# Patient Record
Sex: Female | Born: 1935 | Race: White | Hispanic: No | Marital: Single
Health system: Southern US, Community
[De-identification: ages and names within clinical notes are randomized; demographics above are authoritative.]

---

## 2003-01-30 ENCOUNTER — Other Ambulatory Visit: Payer: Self-pay

## 2004-10-10 ENCOUNTER — Ambulatory Visit: Payer: Self-pay | Admitting: General Surgery

## 2004-10-10 ENCOUNTER — Other Ambulatory Visit: Payer: Self-pay

## 2004-10-16 ENCOUNTER — Ambulatory Visit: Payer: Self-pay | Admitting: General Surgery

## 2004-11-16 ENCOUNTER — Ambulatory Visit: Payer: Self-pay | Admitting: Internal Medicine

## 2004-12-15 ENCOUNTER — Ambulatory Visit: Payer: Self-pay | Admitting: Internal Medicine

## 2005-01-15 ENCOUNTER — Ambulatory Visit: Payer: Self-pay | Admitting: Internal Medicine

## 2005-02-15 ENCOUNTER — Ambulatory Visit: Payer: Self-pay | Admitting: Internal Medicine

## 2005-03-15 ENCOUNTER — Ambulatory Visit: Payer: Self-pay | Admitting: Internal Medicine

## 2005-04-15 ENCOUNTER — Ambulatory Visit: Payer: Self-pay | Admitting: Internal Medicine

## 2005-05-15 ENCOUNTER — Ambulatory Visit: Payer: Self-pay | Admitting: Internal Medicine

## 2005-05-30 ENCOUNTER — Ambulatory Visit: Payer: Self-pay | Admitting: General Surgery

## 2005-06-15 ENCOUNTER — Ambulatory Visit: Payer: Self-pay | Admitting: Internal Medicine

## 2005-07-15 ENCOUNTER — Ambulatory Visit: Payer: Self-pay | Admitting: Internal Medicine

## 2005-08-15 ENCOUNTER — Ambulatory Visit: Payer: Self-pay | Admitting: Internal Medicine

## 2005-09-15 ENCOUNTER — Ambulatory Visit: Payer: Self-pay | Admitting: Internal Medicine

## 2005-10-15 ENCOUNTER — Ambulatory Visit: Payer: Self-pay | Admitting: Internal Medicine

## 2005-11-15 ENCOUNTER — Ambulatory Visit: Payer: Self-pay | Admitting: Internal Medicine

## 2005-11-19 ENCOUNTER — Ambulatory Visit: Payer: Self-pay | Admitting: General Surgery

## 2005-12-15 ENCOUNTER — Ambulatory Visit: Payer: Self-pay | Admitting: Internal Medicine

## 2006-01-15 ENCOUNTER — Ambulatory Visit: Payer: Self-pay | Admitting: Internal Medicine

## 2006-02-15 ENCOUNTER — Ambulatory Visit: Payer: Self-pay | Admitting: Internal Medicine

## 2006-03-16 ENCOUNTER — Ambulatory Visit: Payer: Self-pay | Admitting: Internal Medicine

## 2006-04-16 ENCOUNTER — Ambulatory Visit: Payer: Self-pay | Admitting: Internal Medicine

## 2006-05-16 ENCOUNTER — Ambulatory Visit: Payer: Self-pay | Admitting: Internal Medicine

## 2006-06-16 ENCOUNTER — Ambulatory Visit: Payer: Self-pay | Admitting: Internal Medicine

## 2006-07-02 ENCOUNTER — Ambulatory Visit: Payer: Self-pay | Admitting: Internal Medicine

## 2006-07-10 ENCOUNTER — Other Ambulatory Visit: Payer: Self-pay

## 2006-07-10 ENCOUNTER — Ambulatory Visit: Payer: Self-pay | Admitting: Ophthalmology

## 2006-07-16 ENCOUNTER — Ambulatory Visit: Payer: Self-pay | Admitting: Radiation Oncology

## 2006-07-17 ENCOUNTER — Ambulatory Visit: Payer: Self-pay | Admitting: Ophthalmology

## 2006-08-01 ENCOUNTER — Ambulatory Visit: Payer: Self-pay | Admitting: Internal Medicine

## 2006-08-16 ENCOUNTER — Ambulatory Visit: Payer: Self-pay | Admitting: Radiation Oncology

## 2006-08-16 ENCOUNTER — Ambulatory Visit: Payer: Self-pay | Admitting: Internal Medicine

## 2006-09-16 ENCOUNTER — Ambulatory Visit: Payer: Self-pay | Admitting: Internal Medicine

## 2006-10-16 ENCOUNTER — Ambulatory Visit: Payer: Self-pay | Admitting: Internal Medicine

## 2006-10-21 ENCOUNTER — Ambulatory Visit: Payer: Self-pay | Admitting: General Surgery

## 2006-10-22 ENCOUNTER — Ambulatory Visit: Payer: Self-pay | Admitting: Internal Medicine

## 2006-11-16 ENCOUNTER — Ambulatory Visit: Payer: Self-pay | Admitting: Internal Medicine

## 2006-12-16 ENCOUNTER — Ambulatory Visit: Payer: Self-pay | Admitting: Internal Medicine

## 2007-01-16 ENCOUNTER — Ambulatory Visit: Payer: Self-pay | Admitting: Internal Medicine

## 2007-02-16 ENCOUNTER — Ambulatory Visit: Payer: Self-pay | Admitting: Internal Medicine

## 2007-03-16 ENCOUNTER — Ambulatory Visit: Payer: Self-pay | Admitting: Internal Medicine

## 2007-04-16 ENCOUNTER — Ambulatory Visit: Payer: Self-pay | Admitting: Internal Medicine

## 2007-05-16 ENCOUNTER — Ambulatory Visit: Payer: Self-pay | Admitting: Internal Medicine

## 2007-06-16 ENCOUNTER — Ambulatory Visit: Payer: Self-pay | Admitting: Internal Medicine

## 2007-07-16 ENCOUNTER — Ambulatory Visit: Payer: Self-pay | Admitting: Internal Medicine

## 2007-08-16 ENCOUNTER — Ambulatory Visit: Payer: Self-pay | Admitting: Internal Medicine

## 2007-09-16 ENCOUNTER — Ambulatory Visit: Payer: Self-pay | Admitting: Internal Medicine

## 2007-10-21 ENCOUNTER — Ambulatory Visit: Payer: Self-pay | Admitting: Internal Medicine

## 2007-10-22 ENCOUNTER — Ambulatory Visit: Payer: Self-pay | Admitting: Family Medicine

## 2007-11-16 ENCOUNTER — Ambulatory Visit: Payer: Self-pay | Admitting: Internal Medicine

## 2007-11-25 ENCOUNTER — Ambulatory Visit: Payer: Self-pay | Admitting: Internal Medicine

## 2007-12-16 ENCOUNTER — Ambulatory Visit: Payer: Self-pay | Admitting: Internal Medicine

## 2008-01-16 ENCOUNTER — Ambulatory Visit: Payer: Self-pay | Admitting: Internal Medicine

## 2008-02-16 ENCOUNTER — Ambulatory Visit: Payer: Self-pay | Admitting: Internal Medicine

## 2008-02-17 ENCOUNTER — Ambulatory Visit: Payer: Self-pay | Admitting: Internal Medicine

## 2008-03-15 ENCOUNTER — Ambulatory Visit: Payer: Self-pay | Admitting: Internal Medicine

## 2008-04-15 ENCOUNTER — Ambulatory Visit: Payer: Self-pay | Admitting: Internal Medicine

## 2008-05-15 ENCOUNTER — Ambulatory Visit: Payer: Self-pay | Admitting: Internal Medicine

## 2008-06-08 ENCOUNTER — Ambulatory Visit: Payer: Self-pay | Admitting: Internal Medicine

## 2008-06-15 ENCOUNTER — Ambulatory Visit: Payer: Self-pay | Admitting: Internal Medicine

## 2008-07-15 ENCOUNTER — Ambulatory Visit: Payer: Self-pay | Admitting: Internal Medicine

## 2008-08-15 ENCOUNTER — Ambulatory Visit: Payer: Self-pay | Admitting: Internal Medicine

## 2008-09-15 ENCOUNTER — Ambulatory Visit: Payer: Self-pay | Admitting: Internal Medicine

## 2008-10-15 ENCOUNTER — Ambulatory Visit: Payer: Self-pay | Admitting: Internal Medicine

## 2008-11-15 ENCOUNTER — Ambulatory Visit: Payer: Self-pay | Admitting: Internal Medicine

## 2008-12-21 ENCOUNTER — Ambulatory Visit: Payer: Self-pay | Admitting: Internal Medicine

## 2009-01-15 ENCOUNTER — Ambulatory Visit: Payer: Self-pay | Admitting: Internal Medicine

## 2009-02-22 ENCOUNTER — Ambulatory Visit: Payer: Self-pay | Admitting: Internal Medicine

## 2009-03-15 ENCOUNTER — Ambulatory Visit: Payer: Self-pay | Admitting: Internal Medicine

## 2009-04-12 ENCOUNTER — Ambulatory Visit: Payer: Self-pay | Admitting: Internal Medicine

## 2009-04-15 ENCOUNTER — Ambulatory Visit: Payer: Self-pay | Admitting: Internal Medicine

## 2009-07-12 ENCOUNTER — Ambulatory Visit: Payer: Self-pay | Admitting: Internal Medicine

## 2009-07-15 ENCOUNTER — Ambulatory Visit: Payer: Self-pay | Admitting: Internal Medicine

## 2009-10-11 ENCOUNTER — Ambulatory Visit: Payer: Self-pay | Admitting: Internal Medicine

## 2009-10-15 ENCOUNTER — Ambulatory Visit: Payer: Self-pay | Admitting: Internal Medicine

## 2009-11-15 ENCOUNTER — Ambulatory Visit: Payer: Self-pay | Admitting: Internal Medicine

## 2009-12-15 ENCOUNTER — Ambulatory Visit: Payer: Self-pay | Admitting: Internal Medicine

## 2013-01-09 ENCOUNTER — Inpatient Hospital Stay: Payer: Self-pay | Admitting: Internal Medicine

## 2013-01-09 LAB — COMPREHENSIVE METABOLIC PANEL
Alkaline Phosphatase: 103 U/L
Anion Gap: 19 — ABNORMAL HIGH (ref 7–16)
BUN: 75 mg/dL — ABNORMAL HIGH (ref 7–18)
Bilirubin,Total: 0.5 mg/dL (ref 0.2–1.0)
Chloride: 90 mmol/L — ABNORMAL LOW (ref 98–107)
Creatinine: 4.29 mg/dL — ABNORMAL HIGH (ref 0.60–1.30)
EGFR (African American): 10 — ABNORMAL LOW
EGFR (Non-African Amer.): 9 — ABNORMAL LOW
SGOT(AST): 24 U/L (ref 15–37)
SGPT (ALT): 19 U/L (ref 12–78)
Sodium: 126 mmol/L — ABNORMAL LOW (ref 136–145)
Total Protein: 6.9 g/dL (ref 6.4–8.2)

## 2013-01-09 LAB — CBC WITH DIFFERENTIAL/PLATELET
Basophil #: 0 10*3/uL (ref 0.0–0.1)
Basophil %: 0.2 %
Eosinophil #: 0 10*3/uL (ref 0.0–0.7)
Eosinophil %: 0 %
HGB: 10.6 g/dL — ABNORMAL LOW (ref 12.0–16.0)
Lymphocyte #: 0.6 10*3/uL — ABNORMAL LOW (ref 1.0–3.6)
Lymphocyte %: 4 %
MCV: 93 fL (ref 80–100)
Monocyte %: 6.9 %
Neutrophil #: 14.4 10*3/uL — ABNORMAL HIGH (ref 1.4–6.5)
Platelet: 201 10*3/uL (ref 150–440)
RBC: 3.68 10*6/uL — ABNORMAL LOW (ref 3.80–5.20)

## 2013-01-09 LAB — RAPID INFLUENZA A&B ANTIGENS

## 2013-01-09 LAB — CK-MB
CK-MB: 9.5 ng/mL — ABNORMAL HIGH (ref 0.5–3.6)
CK-MB: 8.4 ng/mL — ABNORMAL HIGH (ref 0.5–3.6)

## 2013-01-09 LAB — APTT: Activated PTT: 33.2 secs (ref 23.6–35.9)

## 2013-01-10 DIAGNOSIS — I214 Non-ST elevation (NSTEMI) myocardial infarction: Secondary | ICD-10-CM

## 2013-01-10 DIAGNOSIS — N179 Acute kidney failure, unspecified: Secondary | ICD-10-CM

## 2013-01-10 DIAGNOSIS — E1310 Other specified diabetes mellitus with ketoacidosis without coma: Secondary | ICD-10-CM

## 2013-01-10 DIAGNOSIS — R7989 Other specified abnormal findings of blood chemistry: Secondary | ICD-10-CM

## 2013-01-10 DIAGNOSIS — J189 Pneumonia, unspecified organism: Secondary | ICD-10-CM

## 2013-01-10 LAB — CBC WITH DIFFERENTIAL/PLATELET
Basophil #: 0.1 10*3/uL (ref 0.0–0.1)
Eosinophil %: 0 %
HCT: 28 % — ABNORMAL LOW (ref 35.0–47.0)
Lymphocyte %: 6.5 %
MCH: 29.7 pg (ref 26.0–34.0)
MCHC: 33.3 g/dL (ref 32.0–36.0)
Monocyte #: 0.9 x10 3/mm (ref 0.2–0.9)
Neutrophil #: 12.1 10*3/uL — ABNORMAL HIGH (ref 1.4–6.5)
Platelet: 182 10*3/uL (ref 150–440)
WBC: 14 10*3/uL — ABNORMAL HIGH (ref 3.6–11.0)

## 2013-01-10 LAB — APTT
Activated PTT: 49.8 secs — ABNORMAL HIGH (ref 23.6–35.9)
Activated PTT: 50 secs — ABNORMAL HIGH (ref 23.6–35.9)

## 2013-01-10 LAB — URINALYSIS, COMPLETE
Bilirubin,UR: NEGATIVE
Glucose,UR: >=500 mg/dL
Hyaline Cast: 1
Leukocyte Esterase: NEGATIVE
Nitrite: NEGATIVE
Ph: 5
Protein: 30
RBC,UR: 2 /HPF
Specific Gravity: 1.014
Squamous Epithelial: 2
WBC UR: 5 /HPF

## 2013-01-10 LAB — COMPREHENSIVE METABOLIC PANEL
BUN: 77 mg/dL — ABNORMAL HIGH (ref 7–18)
Bilirubin,Total: 0.3 mg/dL (ref 0.2–1.0)
Calcium, Total: 8.5 mg/dL (ref 8.5–10.1)
Creatinine: 4.27 mg/dL — ABNORMAL HIGH (ref 0.60–1.30)
Glucose: 225 mg/dL — ABNORMAL HIGH (ref 65–99)
Osmolality: 295 (ref 275–301)
Potassium: 3.8 mmol/L (ref 3.5–5.1)
SGOT(AST): 25 U/L (ref 15–37)
SGPT (ALT): 17 U/L (ref 12–78)
Sodium: 132 mmol/L — ABNORMAL LOW (ref 136–145)
Total Protein: 6 g/dL — ABNORMAL LOW (ref 6.4–8.2)

## 2013-01-10 LAB — TSH: Thyroid Stimulating Horm: 0.231 u[IU]/mL — ABNORMAL LOW

## 2013-01-10 LAB — LIPID PANEL
Cholesterol: 109 mg/dL (ref 0–200)
HDL Cholesterol: 22 mg/dL — ABNORMAL LOW (ref 40–60)
Ldl Cholesterol, Calc: 53 mg/dL (ref 0–100)
Triglycerides: 169 mg/dL (ref 0–200)

## 2013-01-10 LAB — HEMOGLOBIN A1C: Hemoglobin A1C: 11.2 % — ABNORMAL HIGH (ref 4.2–6.3)

## 2013-01-10 LAB — CK-MB: CK-MB: 8.2 ng/mL — ABNORMAL HIGH (ref 0.5–3.6)

## 2013-01-10 LAB — MAGNESIUM: Magnesium: 1.5 mg/dL — ABNORMAL LOW

## 2013-01-11 DIAGNOSIS — I059 Rheumatic mitral valve disease, unspecified: Secondary | ICD-10-CM

## 2013-01-11 LAB — CBC WITH DIFFERENTIAL/PLATELET
Basophil #: 0.1 10*3/uL (ref 0.0–0.1)
Eosinophil #: 0 10*3/uL (ref 0.0–0.7)
HCT: 30.7 % — ABNORMAL LOW (ref 35.0–47.0)
HGB: 10.2 g/dL — ABNORMAL LOW (ref 12.0–16.0)
MCV: 89 fL (ref 80–100)
Monocyte #: 0.3 x10 3/mm (ref 0.2–0.9)
Neutrophil #: 15.8 10*3/uL — ABNORMAL HIGH (ref 1.4–6.5)
Platelet: 204 10*3/uL (ref 150–440)
RBC: 3.45 10*6/uL — ABNORMAL LOW (ref 3.80–5.20)
RDW: 14.5 % (ref 11.5–14.5)

## 2013-01-11 LAB — BASIC METABOLIC PANEL
Anion Gap: 10 (ref 7–16)
BUN: 74 mg/dL — ABNORMAL HIGH (ref 7–18)
Calcium, Total: 8.7 mg/dL (ref 8.5–10.1)
Chloride: 102 mmol/L (ref 98–107)
Creatinine: 3.93 mg/dL — ABNORMAL HIGH (ref 0.60–1.30)
EGFR (African American): 11 — ABNORMAL LOW
EGFR (Non-African Amer.): 10 — ABNORMAL LOW
Osmolality: 289 (ref 275–301)
Potassium: 4.6 mmol/L (ref 3.5–5.1)

## 2013-01-11 LAB — PRO B NATRIURETIC PEPTIDE: B-Type Natriuretic Peptide: 11232 pg/mL — ABNORMAL HIGH (ref 0–450)

## 2013-01-12 ENCOUNTER — Encounter: Payer: Self-pay | Admitting: *Deleted

## 2013-01-12 LAB — BASIC METABOLIC PANEL
BUN: 76 mg/dL — ABNORMAL HIGH (ref 7–18)
EGFR (African American): 12 — ABNORMAL LOW
EGFR (Non-African Amer.): 10 — ABNORMAL LOW

## 2013-01-12 LAB — EXPECTORATED SPUTUM ASSESSMENT W GRAM STAIN, RFLX TO RESP C

## 2013-01-14 LAB — CULTURE, BLOOD (SINGLE)

## 2013-02-15 DEATH — deceased

## 2014-05-07 NOTE — Consult Note (Signed)
 General Aspect 81-year-old female with severe hearing loss, history of breast cancer,  status post radiation therapy presenting with several days of malaise, anorexia, significant chills, fevers, significant nausea and vomiting for 4 days, shortness of breath with increased congestion, and sputum. Cardiology was consulted for elevated cardiac enz.  Family reports she has been feeling very weak with severe malaise, body aches. Several memb ers of her  family are positive flu. She has not been eating for the past few days.    she appeared dehydrated, labs confirming acute kidney failure, left lower lobe pneumonia on CXR, positive troponins, glucose level >600. The patient is admitted for NSTEMI, sepsis, PNA, DKA.   Present Illness . FAMILY HISTORY: Positive for MI in several sisters. Positive diabetes in her family. Her mother died when she was born, and her father left the family soon after. No other history of cancer in the family.   SOCIAL HISTORY: The patient used to smoke 1 pack a day for around 10 of 15 years, and she quit over 50 years ago. She does not drink. She lives at home with her son and daughter-in-law.   Physical Exam:  GEN well developed, well nourished, no acute distress   HEENT moist oral mucosa, Oropharynx clear, very hard of hearing   NECK supple   RESP normal resp effort   CARD Regular rate and rhythm  Murmur   Murmur Systolic   ABD soft  normal BS   LYMPH negative neck   EXTR negative edema   SKIN normal to palpation   NEURO motor/sensory function intact   PSYCH alert, A+O to time, place, person, good insight   Review of Systems:  Subjective/Chief Complaint Cough, malaise   General: Fatigue  Weakness   Skin: No Complaints   ENT: No Complaints   Eyes: No Complaints   Neck: No Complaints   Respiratory: Frequent cough  Wheezing   Cardiovascular: Dyspnea   Gastrointestinal: No Complaints   Genitourinary: No Complaints   Vascular: No  Complaints   Musculoskeletal: No Complaints   Neurologic: No Complaints   Hematologic: No Complaints   Endocrine: No Complaints   Psychiatric: No Complaints   Review of Systems: All other systems were reviewed and found to be negative   Medications/Allergies Reviewed Medications/Allergies reviewed     Diabetes:    breast cancer:        Admit Diagnosis:   SEPSIS: Onset Date: 10-Jan-2013, Status: Active, Description: SEPSIS  Home Medications: Medication Instructions Status  ferrous sulfate 325 mg oral enteric coated tablet 1 tab(s) orally 2 times a day  Active  citalopram 20 mg oral tablet 1 tab(s) orally once a day Active  lisinopril 20 mg oral tablet 1 tab(s) orally once a day Active  HumuLIN N human recombinant 100 units/mL subcutaneous suspension sliding scale subcutaneous as directed Active  acetaminophen-traMADol 325 mg-37.5 mg oral tablet 1 tab(s) orally 2 times a day, As Needed - for Pain Active  pravastatin 40 mg oral tablet 1 tab(s) orally once a day Active  Januvia 100 mg oral tablet 1 tab(s) orally once a day Active  Symbicort 80 mcg-4.5 mcg/inh inhalation aerosol 1 puff(s) inhaled 2 times a day Active  CeleBREX 200 mg oral capsule 1 cap(s) orally 2 times a day Active  glimepiride 4 mg oral tablet 1 tab(s) orally once a day Active  Caltrate 600 mg oral tablet 1 tab(s) orally once a day Active   Lab Results:  Thyroid:  27-Dec-14 01:41   Thyroid Stimulating   Hormone  0.231 (0.45-4.50 (International Unit)  ----------------------- Pregnant patients have  different reference  ranges for TSH:  - - - - - - - - - -  Pregnant, first trimetser:  0.36 - 2.50 uIU/mL)  Hepatic:  27-Dec-14 01:41   Bilirubin, Total 0.3  Alkaline Phosphatase 78 (45-117 NOTE: New Reference Range 12/05/12)  SGPT (ALT) 17  SGOT (AST) 25  Total Protein, Serum  6.0  Albumin, Serum  1.9  Routine Chem:  27-Dec-14 01:41   Glucose, Serum  225  BUN  77  Creatinine (comp)  4.27   Sodium, Serum  132  Potassium, Serum 3.8  Chloride, Serum 99  CO2, Serum 23  Calcium (Total), Serum 8.5  Osmolality (calc) 295  eGFR (African American)  10  eGFR (Non-African American)  9 (eGFR values <60mL/min/1.73 m2 may be an indication of chronic kidney disease (CKD). Calculated eGFR is useful in patients with stable renal function. The eGFR calculation will not be reliable in acutely ill patients when serum creatinine is changing rapidly. It is not useful in  patients on dialysis. The eGFR calculation may not be applicable to patients at the low and high extremes of body sizes, pregnant women, and vegetarians.)  Anion Gap 10  Magnesium, Serum  1.5 (1.8-2.4 THERAPEUTIC RANGE: 4-7 mg/dL TOXIC: > 10 mg/dL  -----------------------)  Hemoglobin A1c ([facility])  11.2 (The American Diabetes Association recommends that a primary goal of therapy should be <7% and that physicians should reevaluate the treatment regimen in patients with HbA1c values consistently >8%.)  Cholesterol, Serum 109  Triglycerides, Serum 169  HDL (INHOUSE)  22  VLDL Cholesterol Calculated 34  LDL Cholesterol Calculated 53 (Result(s) reported on 10 Jan 2013 at 02:11AM.)  Cardiac:  27-Dec-14 01:41   CPK-MB, Serum  8.2 (Result(s) reported on 10 Jan 2013 at 02:19AM.)  Routine Hem:  27-Dec-14 01:41   WBC (CBC)  14.0  RBC (CBC)  3.13  Hemoglobin (CBC)  9.3  Hematocrit (CBC)  28.0  Platelet Count (CBC) 182  MCV 89  MCH 29.7  MCHC 33.3  RDW 13.9  Neutrophil % 86.7  Lymphocyte % 6.5  Monocyte % 6.3  Eosinophil % 0.0  Basophil % 0.5  Neutrophil #  12.1  Lymphocyte #  0.9  Monocyte # 0.9  Eosinophil # 0.0  Basophil # 0.1 (Result(s) reported on 10 Jan 2013 at 02:02AM.)   EKG:  Interpretation EKG shows NSR with rate 100 bpm, no significant ST or T wave changes   Radiology Results: XRay:    26-Dec-14 17:24, Chest Portable Single View  Chest Portable Single View   REASON FOR EXAM:    short of  breath  COMMENTS:       PROCEDURE: DXR - DXR PORTABLE CHEST SINGLE VIEW  - Jan 09 2013  5:24PM     CLINICAL DATA:  Shortness of breath, vomiting    EXAM:  PORTABLE CHEST - 1 VIEW    COMPARISON:  10/23/2005    FINDINGS:  There is patchy left lower lobe and left mid lung zone airspace  opacity with small left pleural effusion noted, superimposed on  previous left basilar presumed scarring. The heart size is normal.  Right lung is grossly clear with mildly prominent interstitial  markings but no focal abnormality.     IMPRESSION:  Patchy left lower lobe and left mid lung zone airspace disease which  could represent pneumonia although followup to radiographic  resolution is recommended 4-6 weeks to exclude underlying  malignancy.        Electronically Signed    By: Conchita Paris M.D.    On: 01/09/2013 17:37     Verified By: Arline Asp, M.D.,    No Known Allergies:   Vital Signs/Nurse's Notes: **Vital Signs.:   27-Dec-14 08:00  Vital Signs Type Routine  Temperature Temperature (F) 98.6  Celsius 37  Temperature Source oral  Pulse Pulse 82  Respirations Respirations 41  Systolic BP Systolic BP 846  Diastolic BP (mmHg) Diastolic BP (mmHg) 53  Mean BP 70  Pulse Ox % Pulse Ox % 97  Pulse Ox Activity Level  At rest  Oxygen Delivery 3L  Pulse Ox Heart Rate 64    Impression 81 year old female with severe hearing loss, history of breast cancer,  status post radiation therapy presenting with several days of malaise, anorexia, significant chills, fevers, significant nausea and vomiting for 4 days, shortness of breath with increased congestion, and sputum. Cardiology was consulted for elevated cardiac enz.  1)NSTEMI likely not a primary ischemic event, CKMB trending down, no chest pain sx, no complaints today on heparin ggt Elevated cardiac markers from demand ischemia (sepsis/pna, renal failure, anemia, DKA) in setting of possible underlying CAD --as enz trending  downward, could hold heparin ggt. start on subcutaneous heparin continue aspirin  2)DKA: on insulin ggt likely from sepsis  3)PNA/sepsis on ABX, Left lower lobe, LML sx better today  4) Renal failure likely from dehydration, unable to exclude a component of ATN would monitor, gentle IVF   Electronic Signatures: Ida Rogue (MD)  (Signed 27-Dec-14 10:51)  Authored: General Aspect/Present Illness, History and Physical Exam, Review of System, Past Medical History, Health Issues, Home Medications, Labs, EKG , Radiology, Allergies, Vital Signs/Nurse's Notes, Impression/Plan   Last Updated: 27-Dec-14 10:51 by Ida Rogue (MD)

## 2014-05-07 NOTE — Discharge Summary (Signed)
PATIENT NAME:  Margaret Cherry, Margaret Cherry MR#:  [id number] DATE OF BIRTH:  [birth year]  DATE OF ADMISSION:  01/09/2013 DATE OF DISCHARGE:  01/12/2013  ADMISSION DIAGNOSES: 1. Sepsis.  2. Pneumonia.  3. Diabetic ketoacidosis. 4.  Elevated troponins.   DISCHARGE DIAGNOSES: 1. Sepsis secondary to pneumonia.  2. DKA secondary to pneumonia.  3. Elevated troponins, not acute coronary syndrome. This is secondary to demand ischemia.  4. Renal failure, likely chronic.  5. Hypomagnesemia.  6. Abnormal TSH normal  T4.   CONSULTS:  1. Dr. Mariah MillingGollan.  2. Dr. Thedore MinsSingh.    LABORATORY DATA: Sodium 133, potassium 4.2, chloride 102, bicarbonate 24, BUN 76, creatinine 3.86, glucose is 270.   HOSPITAL COURSE: [name] who presented with cough, shortness of breath and chest  tightness. For further details, please refer to history and physcial.  1.  Sepsis secondary to pneumonia. The patient was found to have community-acquired pneumonia on chest x-ray. She has multiorgan organ failure. Her sepsis is improving. 2. Community-acquired pneumonia. The patient is on Zosyn and azithromycin. She is being discharged with Levaquin. She does need some oxygen at discharge, although her pneumonia is getting better. She also has suspected influenza since she has had exposure. Her blood cultures are negative to date. Her influenza swab was negative. She will be discharged with Levaquin as well as Tamiflu.  3. Diabetic ketoacidosis from community-acquired pneumonia has resolved. We stopped her outpatient medications due to her renal failure. She is on sliding scale insulin and low dose amaryl. She should follow up with her primary doctor as to what medications would be best for her for her diabetes.  4. Elevated troponin, thought not to be from a non-STEMI per cardiology consult, it is likely secondary to demand ischemia from her sepsis and pneumonia.  5. Renal failure, probably a there is a chronic component. Her creatinine  has not much changed. There was a question if this is prerenal azotemia from some dehydration. Her creatinine has not really much changed with IV fluids. I suspect that she has stage III chronic renal failure. Her renal ultrasound did not show evidence of hydronephrosis. Her urine output did improve with IV fluids. She could also have ATN from sepsis and pneumonia. 6. Hypomagnesia, which  was repleted.  7. Abnormal TSH, but normal T4, likely sick euthyroid syndrome.   DISCHARGE MEDICATIONS: 1. Ferrous sulfate 325 b.i.d.  2. Citalopram 20 mg daily.  3. Tylenol/tramadol 325/37.5 b.i.d. p.r.n.  4. Pravastatin 40 mg daily.  5. Symbicort 1 puff b.i.d.  6. Caltrate 600 mg daily.  7. Sliding scale insulin.  8. Tamiflu 30 mg daily for five days.  9. Metoprolol 50 mg q.12 hours.  10. Levaquin 500 mg q.48 hours x 5 days.  11. Glucerna shakes t.i.d.  12. amaryl 2mg  daily  The patient shoul dhave close monitoring of her blood sugars  as her creatinie is elevated.   DISCHARGE OXYGEN: 2 liters nasal cannula.   DISCHARGE DIET: ADA.   DISCHARGE SUPPLEMENT: Glucerna t.i.d.    DISCHARGE ACTIVITY: As tolerated physical therapy. The patient will follow up with Dr. Mosetta PigeonHarmeet Cherry in one week. Again, the patient should have Accu-Cheks q. a.c. and at bedtime.  TIME SPENT: Approximately 40 minutes. The patient is medically stable for discharge.   ____________________________ Karoline Fleer P. Juliene PinaMody, MD spm:sg D: 01/12/2013 14:23:14 ET T: 01/12/2013 14:33:10 ET JOB#: [id number]  cc: Delyla Sandeen P. Juliene PinaMody, MD, <Dictator> Janyth ContesSITAL P Sundra Haddix MD ELECTRONICALLY SIGNED 01/12/2013 15:01

## 2014-05-07 NOTE — H&P (Signed)
PATIENT NAME:  Margaret Cherry, Margaret Cherry MR#:  [id number] DATE OF BIRTH:  December 16, 1924  DATE OF ADMISSION:  01/09/2013  REASON FOR ADMISSION: Cough, shortness of breath, chest tightness.   HISTORY OF PRESENT ILLNESS: This is a very nice 81 year old female. She is a very poor historian. She has severe hearing loss, and even her family has a problem communicating with her. She is 79, she has history of breast cancer, which has been clean for over 9 years, status post radiation therapy. The patient started developing 4 days ago malaise, not being able to eat, not being able to drink. She had significant chills, fevers, feeling cold and hot at the same time, had significant nausea and vomiting for 4 days, shortness of breath with increased congestion, and now her sputum is brown and green.   The patient has been feeling very weak and severe malaise, with a little bit of body aches. In her family members 3 of them have positive flu. The patient was brought to the Emergency Department, where she looks dehydrated. She has acute kidney failure, left lower lobe pneumonia, and positive troponins. The patient is admitted for treatment and control of these issues.   REVIEW OF SYSTEMS:  A 12-system review of systems.  GENERAL:  Positive fever. Positive fatigue. Positive weakness. No weight loss or weight gain.  EYES: No blurry vision, double vision.  ENT: No difficulty swallowing or tinnitus.  RESPIRATORY: Positive cough. Positive wheezing. Positive painful respirations.  CARDIOVASCULAR: No chest pain, orthopnea. Positive chest tightness. No palpitations.  GENITOURINARY: No dysuria, hematuria.   GASTROINTESTINAL: No abdominal pain. Positive nausea. Positive vomiting x 2 today. No GERD. No hematemesis. No melena.  GYNECOLOGIC: No breast masses.  ENDOCRINE: No polyuria, polydipsia, polyphagia. The patient has been really dry, dehydrated. Not one to drink water. No cold or heat intolerance.  HEMATOLOGIC AND LYMPHATIC: No  anemia, easy bruising or bleeding.  SKIN: No rashes or petechiae.  MUSCULOSKELETAL: No significant neck pain, back pain, or gout.  NEUROLOGIC: No numbness, tingling, CVA or TIA.   PSYCHIATRIC: No insomnia. Positive depression.  PAST MEDICAL HISTORY:  1.  Diabetes.  2.  Breast cancer.  3.  Hypertension.  4.  COPD.   5.  Depression.  6.  Anxiety.  7.  Osteoporosis.  8.  Anemia.   ALLERGIES: No known drug allergies.   SURGICAL HISTORY:  1.  Hysterectomy.  2.  Lumpectomy.  3.  Cholecystectomy.   FAMILY HISTORY: Positive for MI in several sisters. Positive diabetes in her family. Her mother died when she was born, and her father left the family soon after. No other history of cancer in the family.   SOCIAL HISTORY: The patient used to smoke 1 pack a day for around 10 of 15 years, and she quit over 50 years ago. She does not drink. She lives at home with her son and daughter-in-law.   CURRENT MEDICATIONS: Symbicort 80 mcg twice daily, pravastatin 40 mg daily, lisinopril 20 mg once a day, Januvia 100 mg once a day, Humulin sliding scale, 4 mg once a day, ferrous sulfate 325 mg twice daily, citalopram 10 mg daily, Celebrex 200 mg twice daily, Caltrate once a day, acetaminophen with tramadol 1 twice daily.   PHYSICAL EXAMINATION: VITAL SIGNS: Blood pressure 144/60, pulse 91, respirations 18, temperature 99.5, oxygen saturation 94% on oxygen. There is no oxygen saturation documented on room air. GENERAL:  She looks critically ill, debilitated. She is very hard of hearing. She is deaf on the left  side and hard to hear on the right side. She looks dehydrated. She looks very pale. She is not in any acute distress right now.  HEENT: Her pupils are equal and reactive. Pale conjunctivae. No oral lesions. No oropharyngeal exudates. Very dry mucosa. Anicteric sclerae. Normocephalic, atraumatic.  NECK: Supple. No JVD. No thyromegaly. No adenopathy. No rigidity.  CARDIOVASCULAR: Regular rate and  rhythm. Tachycardic. No murmurs, rubs or gallops are appreciated at this moment. No displacement of PMI. No tenderness to palpation anterior chest wall. The patient feels tight on her anterior chest, though.  LUNGS: Significant rales at the level of the left lower lobe, and some rhonchi, diffuse, in both respiratory fields. The patient is not wheezing at this moment. There is no use of accessory muscles. There is significant cough with a lot of congestion, congestive sounds when she coughs.  ABDOMEN: Soft, nontender, nondistended. No hepatosplenomegaly. No masses. Bowel sounds are positive. No rebound, no guarding. No hepatosplenomegaly.  GENITAL: Negative for external lesions.  EXTREMITIES: No significant edema, cyanosis or clubbing.  VASCULAR: Pulses +2. Capillary refill around 6 seconds.  SKIN: Decreased turgor. No petechiae or rashes.   MUSCULOSKELETAL: No joint effusions or joint deformity.  NEUROLOGIC: Cranial nerves II to XII intact. Strength is 5/5 in 4 extremities.  PSYCHIATRIC: Some agitation. The patient has a flat affect.  LYMPHATIC: Negative for lymphadenopathy in the neck or supraclavicular areas.   RESULTS: Her glucose is 652, her BUN is 75, her creatinine is 4.29, her sodium is 126, corrected, glucoses around 135, 136, chloride less than 90, CO2 is 17, anion gap is 19, elevated. Her albumin is 2.5. Her troponin is 2.5. White count is 16,000, hemoglobin is 10, platelet count 201. Influenza test negative.  pH 7.21, pCO2 of 32, and HCO3 is less than 12.   Chest x-ray: Patchy left lower lobe and left mid lung airspace disease, which could represent pneumonia. Recommend follow up in 4 to 6 weeks.   EKG: No ST depression or elevation. Normal sinus rhythm. Heart rate of 100. Possible previous septal infarct.   ASSESSMENT AND PLAN: An 81 year old female with history of previous breast cancer, diabetes, hypertension, chronic obstructive pulmonary disease, depression, anxiety, osteoporosis,  comes with multiorgan failure, increased troponins, pneumonia, and diabetic ketoacidosis.  1. Sepsis. The patient has multiple findings that make her qualified for a sepsis diagnosis, including elevation of white count up to 16,000, tachycardia, tachypnea. The patient has acute kidney failure and multiorgan failure, with kidney, respiratory, and heart with elevation of troponins. The patient is going to be admitted to the Critical Care Unit and monitored very closely. Broad-spectrum antibiotics have been started. The patient has a left lower lobe infiltrate. Blood cultures taken, sputum culture add-on.   2.  Pneumonia. The patient has left lower lobe pneumonia, likely community-acquired, but with the severity cannot rule out the possibility of superimposed flu or a resistant pathogen. We are going to start her on Zosyn and azithromycin and Tamiflu, as the patient has 3 very close contacts with positive flu.   3. Respiratory failure. The patient on oxygen. She had increased respiratory distress, per ER physician. Her O2 sats were down in the 80s, but documented only down to 92. The patient on supplemental oxygen for  pneumonia.   4.  Acute kidney injury. The patient has a creatinine of 4.29. I do not have a baseline, but the family states that Dr. Lacie ScottsNiemeyer has never told them that there are any issues with elevation of creatinine. The  patient has not been eating and drinking. This could be secondary to #1, intravascular volume depletion.   5. Sepsis, secondary to acute tubular necrosis. She also takes NSAIDs and ACE inhibitors, which we are going to hold due to her acute kidney injury.   6.  Pseudohyponatremia, decreased due to high blood sugars. Glucose is above 600.   7.  Diabetic ketoacidosis. The patient has diabetes that has been well-controlled. At this moment, she meets criteria for DKA with anion gap acidosis. The patient is started on a heparin drip, per protocol, IV fluids, large amounts.  Patient to be transitioned into insulin sliding scale and placed on insulin, once her anion gap is closed and her blood sugars are a little bit better, more tolerable.   8. Metabolic acidosis secondary to DKA and also sepsis. Continue IV fluids, fluid expansion, and antibiotics.   9. Elevation of troponin. This is likely secondary to a troponin leak, but in the context of the patient having chest tightness, we are going to put her on a heparin drip for tonight. Consult Cardiology, page Dr. Mariah Milling.   10.  Follow up troponins every 6 hours. Continue beta blocker. Start the patient on aspirin.   11. History of hypertension. Since the patient has elevation of troponin, add beta blocker, metoprolol 6 mg IV q. 6.    12. Chronic obstructive pulmonary disease. At this moment, I do not want to add steroids. The patient is not wheezing. Add the steroids once the blood sugars are a little bit better. For now, continue on insulin drip, and steroids only inhaled with Advair and budesonide.   13.  Depression. Seems to be stable.   14.  Anemia of chronic disease and iron deficiency. Continue iron. Other medical problems are stable.  15. Deep vein thrombosis prophylaxis with heparin drip. Gastrointestinal prophylaxis with Protonix.   I spent about 60 minutes with this patient in critical care. She has multiorgan failure, acute kidney failure, respiratory failure, severe pneumonia and  DKA. The patient has high risk of cardiovascular collapse and cardiac arrest.    ____________________________ Felipa Furnace, MD rsg:mr D: 01/09/2013 20:29:34 ET T: 01/09/2013 21:40:30 ET JOB#: [id number]  cc: Felipa Furnace, MD, <Dictator> Merrillyn Ackerley Juanda Chance MD ELECTRONICALLY SIGNED 01/12/2013 0:51

## 2015-05-04 IMAGING — CR DG CHEST 2V
1 series · 3 of 3 positions shown · non-contrast
Comparison: January 09, 2013

CLINICAL DATA: Cough and shortness of breath

EXAM:
CHEST  2 VIEW

[Series 1: ap · 0.17mm/px · 3 of 3 slices shown]
[im 1/3]
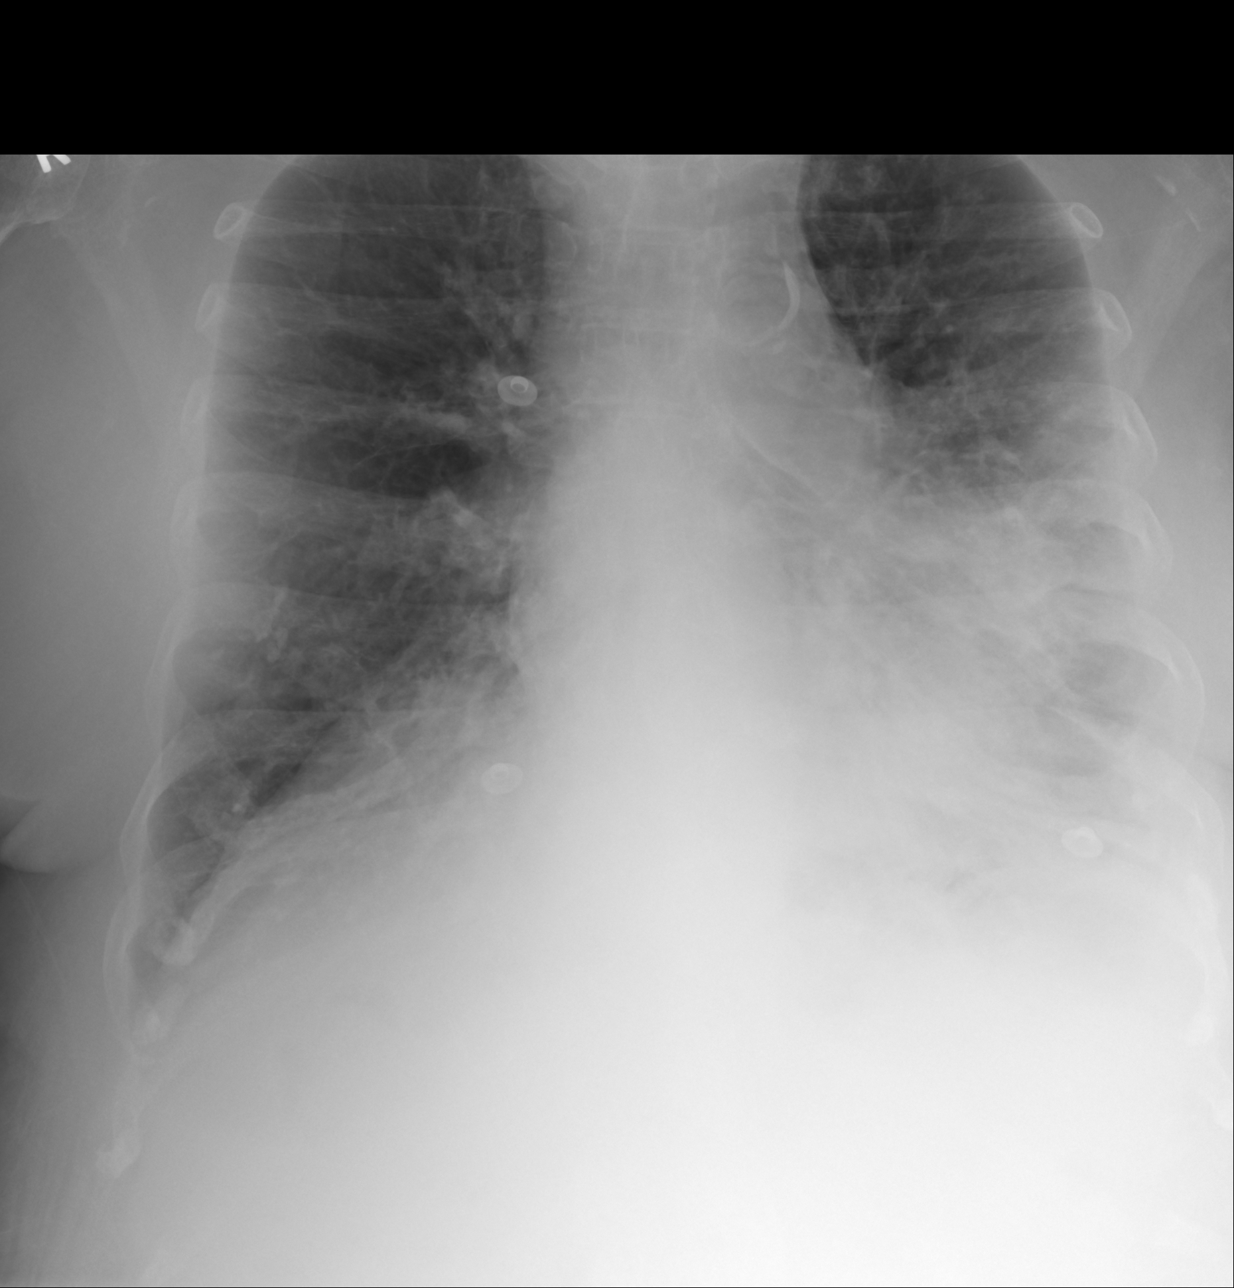
[im 2/3]
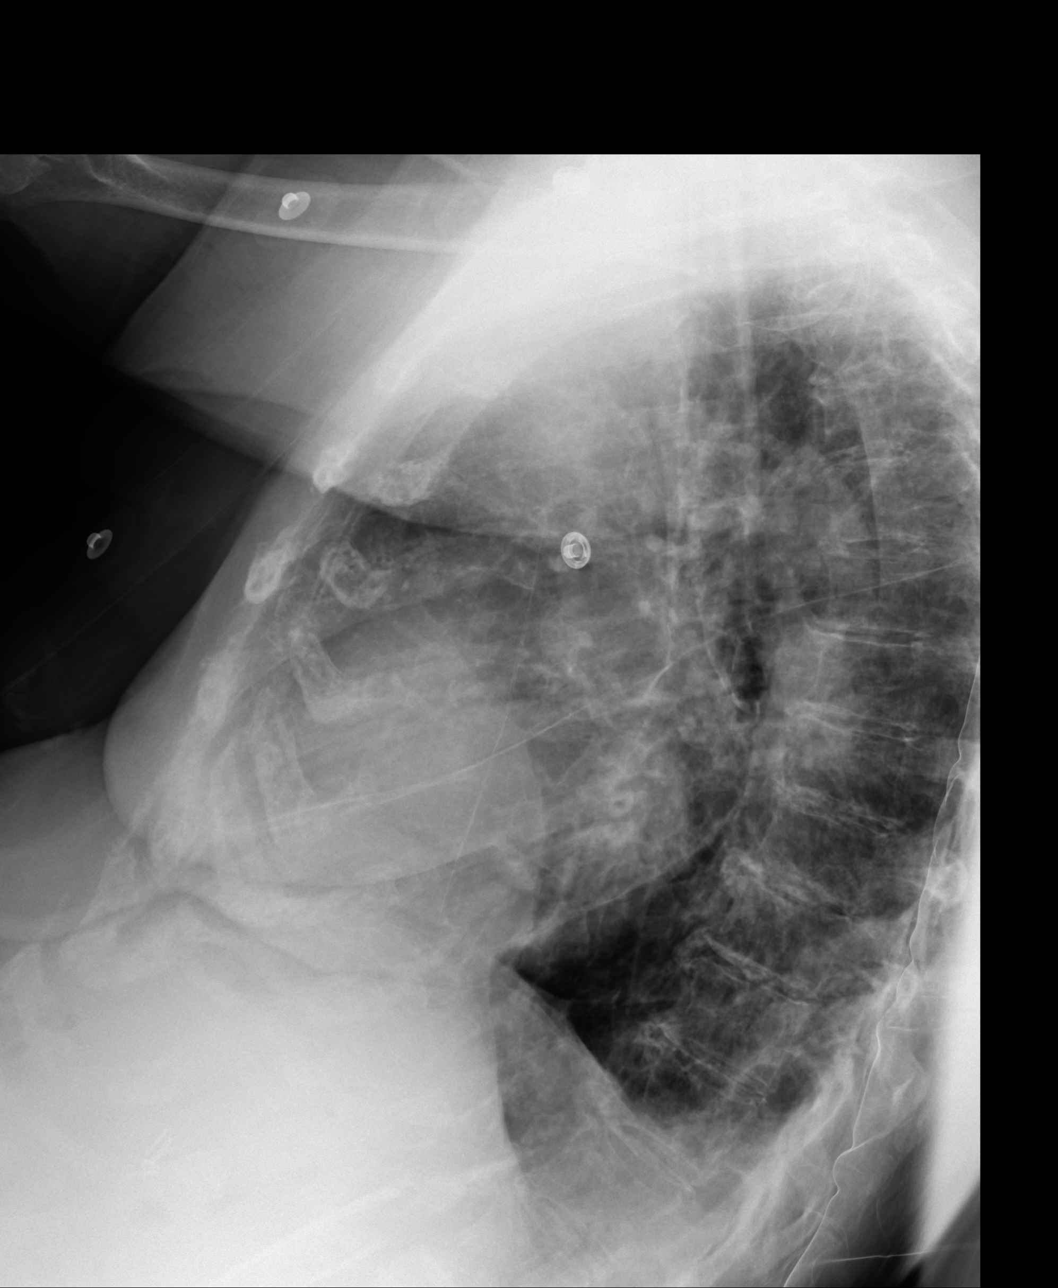
[im 3/3]
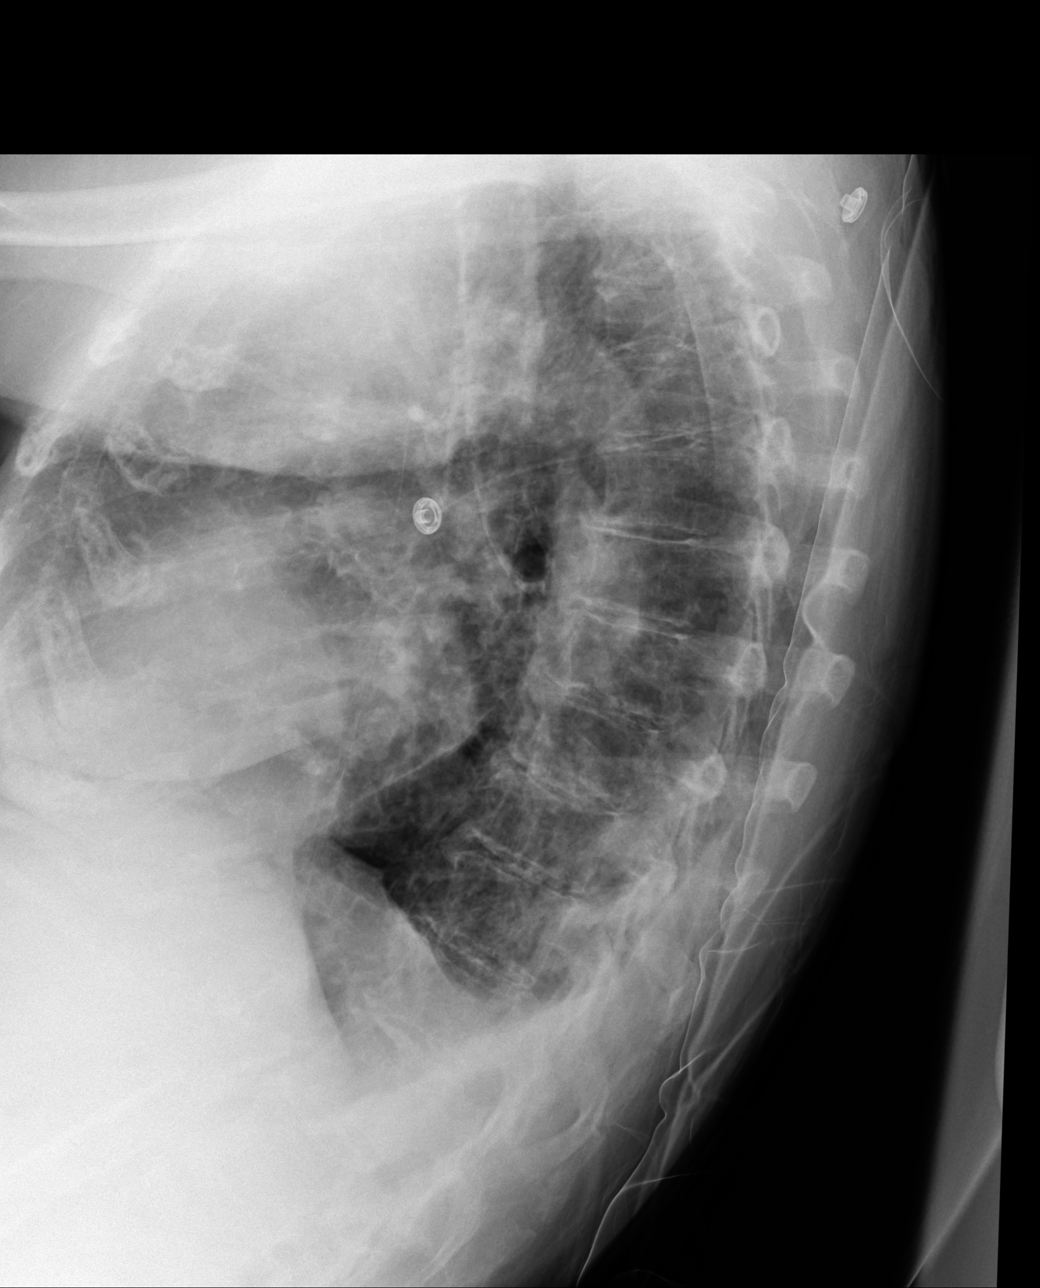

[3 of 3 positions shown; findings below may reference images not displayed]

FINDINGS: There is persistent consolidation in the left mid and lower lung
zones. Right lung is overall clear. There is evidence of a degree of
underlying emphysema. Heart is prominent given the underlying
emphysema. The pulmonary vascularity is within normal limits. There
is atherosclerotic change in the aorta. No adenopathy.
IMPRESSION: Persistent consolidation in the mid and lower lung zones on the
left. Underlying emphysematous change.
